# Patient Record
Sex: Male | Born: 1980 | Race: White | Hispanic: No | Marital: Married | Smoking: Never smoker
Health system: Southern US, Community
[De-identification: ages and names within clinical notes are randomized; demographics above are authoritative.]

## PROBLEM LIST (undated history)

## (undated) DIAGNOSIS — R519 Headache, unspecified: Secondary | ICD-10-CM

## (undated) DIAGNOSIS — M1990 Unspecified osteoarthritis, unspecified site: Secondary | ICD-10-CM

## (undated) DIAGNOSIS — M6282 Rhabdomyolysis: Secondary | ICD-10-CM

## (undated) DIAGNOSIS — R51 Headache: Secondary | ICD-10-CM

## (undated) DIAGNOSIS — J189 Pneumonia, unspecified organism: Secondary | ICD-10-CM

## (undated) HISTORY — PX: ELBOW SURGERY: SHX618

## (undated) HISTORY — PX: HIP SURGERY: SHX245

## (undated) HISTORY — PX: ANKLE SURGERY: SHX546

---

## 2011-10-31 HISTORY — PX: HIP SURGERY: SHX245

## 2012-12-30 HISTORY — PX: HERNIA REPAIR: SHX51

## 2017-07-17 ENCOUNTER — Other Ambulatory Visit: Payer: Self-pay | Admitting: Orthopedic Surgery

## 2017-07-21 NOTE — Pre-Procedure Instructions (Signed)
Lawson Mahone  07/21/2017      [facility] - [place], [place] - [address] [place] [address] Phone: [phone] Fax: [phone]    Your procedure is scheduled on Monday August 6.  Report to [facility] Admitting at 1:00 P.M.  Call this number if you have problems the morning of surgery:  [phone]   Remember:  Do not eat food or drink liquids after midnight.  Take these medicines the morning of surgery with A SIP OF WATER: NONE  7 days prior to surgery STOP taking any Aspirin, Aleve, Naproxen, Ibuprofen, Motrin, Advil, Goody's, BC's, all herbal medications, fish oil, and all vitamins    Do not wear jewelry, make-up or nail polish.  Do not wear lotions, powders, or colognes, or deoderant.    Men may shave face and neck.  Do not bring valuables to the hospital.  [facility] is not responsible for any belongings or valuables.  Contacts, dentures or bridgework may not be worn into surgery.  Leave your suitcase in the car.  After surgery it may be brought to your room.  For patients admitted to the hospital, discharge time will be determined by your treatment team.  Patients discharged the day of surgery will not be allowed to drive home.    Special instructions:    [place]- Preparing For Surgery  Before surgery, you can play an important role. Because skin is not sterile, your skin needs to be as free of germs as possible. You can reduce the number of germs on your skin by washing with CHG (chlorahexidine gluconate) Soap before surgery.  CHG is an antiseptic cleaner which kills germs and bonds with the skin to continue killing germs even after washing.  Please do not use if you have an allergy to CHG or antibacterial soaps. If your skin becomes reddened/irritated stop using the CHG.  Do not shave (including legs and underarms) for at least 48 hours prior to first CHG shower. It is OK to shave your face.  Please follow these  instructions carefully.   1. Shower the NIGHT BEFORE SURGERY and the MORNING OF SURGERY with CHG.   2. If you chose to wash your hair, wash your hair first as usual with your normal shampoo.  3. After you shampoo, rinse your hair and body thoroughly to remove the shampoo.  4. Use CHG as you would any other liquid soap. You can apply CHG directly to the skin and wash gently with a scrungie or a clean washcloth.   5. Apply the CHG Soap to your body ONLY FROM THE NECK DOWN.  Do not use on open wounds or open sores. Avoid contact with your eyes, ears, mouth and genitals (private parts). Wash genitals (private parts) with your normal soap.  6. Wash thoroughly, paying special attention to the area where your surgery will be performed.  7. Thoroughly rinse your body with warm water from the neck down.  8. DO NOT shower/wash with your normal soap after using and rinsing off the CHG Soap.  9. Pat yourself dry with a CLEAN TOWEL.   10. Wear CLEAN PAJAMAS   11. Place CLEAN SHEETS on your bed the night of your first shower and DO NOT SLEEP WITH PETS.    Day of Surgery: Do not apply any deodorants/lotions. Please wear clean clothes to the hospital/surgery center.      Please read over the following fact sheets that you were given. Total  Joint Packet and MRSA Information

## 2017-07-22 ENCOUNTER — Inpatient Hospital Stay (HOSPITAL_COMMUNITY)
Admission: RE | Admit: 2017-07-22 | Discharge: 2017-07-22 | Disposition: A | Discharge status: Home / Self Care | Payer: Self-pay | Source: Ambulatory Visit

## 2017-07-24 ENCOUNTER — Encounter (HOSPITAL_COMMUNITY)
Admission: RE | Admit: 2017-07-24 | Discharge: 2017-07-24 | Disposition: A | Discharge status: Home / Self Care | Payer: BLUE CROSS/BLUE SHIELD | Source: Ambulatory Visit | Attending: Orthopedic Surgery | Admitting: Orthopedic Surgery

## 2017-07-24 ENCOUNTER — Ambulatory Visit (HOSPITAL_COMMUNITY)
Admission: RE | Admit: 2017-07-24 | Discharge: 2017-07-24 | Disposition: A | Discharge status: Home / Self Care | Payer: BLUE CROSS/BLUE SHIELD | Source: Ambulatory Visit | Attending: Orthopedic Surgery | Admitting: Orthopedic Surgery

## 2017-07-24 ENCOUNTER — Encounter (HOSPITAL_COMMUNITY): Payer: Self-pay

## 2017-07-24 DIAGNOSIS — Z01818 Encounter for other preprocedural examination: Secondary | ICD-10-CM | POA: Insufficient documentation

## 2017-07-24 DIAGNOSIS — Z01812 Encounter for preprocedural laboratory examination: Secondary | ICD-10-CM | POA: Insufficient documentation

## 2017-07-24 HISTORY — DX: Headache: R51

## 2017-07-24 HISTORY — DX: Pneumonia, unspecified organism: J18.9

## 2017-07-24 HISTORY — DX: Unspecified osteoarthritis, unspecified site: M19.90

## 2017-07-24 HISTORY — DX: Rhabdomyolysis: M62.82

## 2017-07-24 HISTORY — DX: Headache, unspecified: R51.9

## 2017-07-24 LAB — TYPE AND SCREEN
ABO/RH(D): O POS
ANTIBODY SCREEN: NEGATIVE

## 2017-07-24 LAB — URINALYSIS, ROUTINE W REFLEX MICROSCOPIC
BILIRUBIN URINE: NEGATIVE
Glucose, UA: NEGATIVE mg/dL
Hgb urine dipstick: NEGATIVE
Ketones, ur: NEGATIVE mg/dL
Leukocytes, UA: NEGATIVE
NITRITE: NEGATIVE
PH: 7 (ref 5.0–8.0)
Protein, ur: NEGATIVE mg/dL
SPECIFIC GRAVITY, URINE: 1.019 (ref 1.005–1.030)

## 2017-07-24 LAB — COMPREHENSIVE METABOLIC PANEL
ALT: 48 U/L (ref 17–63)
ANION GAP: 8 (ref 5–15)
AST: 28 U/L (ref 15–41)
Albumin: 4.6 g/dL (ref 3.5–5.0)
Alkaline Phosphatase: 64 U/L (ref 38–126)
BILIRUBIN TOTAL: 0.6 mg/dL (ref 0.3–1.2)
BUN: 12 mg/dL (ref 6–20)
CO2: 28 mmol/L (ref 22–32)
Calcium: 9.6 mg/dL (ref 8.9–10.3)
Chloride: 105 mmol/L (ref 101–111)
Creatinine, Ser: 1.13 mg/dL (ref 0.61–1.24)
Glucose, Bld: 99 mg/dL (ref 65–99)
POTASSIUM: 4.1 mmol/L (ref 3.5–5.1)
Sodium: 141 mmol/L (ref 135–145)
TOTAL PROTEIN: 7.4 g/dL (ref 6.5–8.1)

## 2017-07-24 LAB — SURGICAL PCR SCREEN
MRSA, PCR: NEGATIVE
Staphylococcus aureus: NEGATIVE

## 2017-07-24 LAB — CBC WITH DIFFERENTIAL/PLATELET
Basophils Absolute: 0 10*3/uL (ref 0.0–0.1)
Basophils Relative: 0 %
EOS PCT: 3 %
Eosinophils Absolute: 0.2 10*3/uL (ref 0.0–0.7)
HEMATOCRIT: 47.2 % (ref 39.0–52.0)
Hemoglobin: 16.3 g/dL (ref 13.0–17.0)
LYMPHS PCT: 22 %
Lymphs Abs: 1.5 10*3/uL (ref 0.7–4.0)
MCH: 29.9 pg (ref 26.0–34.0)
MCHC: 34.5 g/dL (ref 30.0–36.0)
MCV: 86.6 fL (ref 78.0–100.0)
MONO ABS: 0.3 10*3/uL (ref 0.1–1.0)
MONOS PCT: 5 %
NEUTROS ABS: 4.7 10*3/uL (ref 1.7–7.7)
Neutrophils Relative %: 70 %
Platelets: 227 10*3/uL (ref 150–400)
RBC: 5.45 MIL/uL (ref 4.22–5.81)
RDW: 12.9 % (ref 11.5–15.5)
WBC: 6.8 10*3/uL (ref 4.0–10.5)

## 2017-07-24 LAB — ABO/RH: ABO/RH(D): O POS

## 2017-07-24 LAB — PROTIME-INR
INR: 1.09
PROTHROMBIN TIME: 14.1 s (ref 11.4–15.2)

## 2017-07-24 LAB — APTT: aPTT: 31 seconds (ref 24–36)

## 2017-07-24 NOTE — Progress Notes (Signed)
Doesn't have a PCP States he saw a cardiologist years ago states everything checked out ok Denies any fever, cough, or chest pain. States [facility] he developed Rhabdomyolysis. The surgery took around 13 hours.

## 2017-07-28 ENCOUNTER — Encounter (HOSPITAL_COMMUNITY): Payer: Self-pay
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
## 2017-08-04 ENCOUNTER — Inpatient Hospital Stay (HOSPITAL_COMMUNITY)
Admission: RE | Admit: 2017-08-04 | Discharge: 2017-08-05 | DRG: 470 | Disposition: A | Discharge status: Home / Self Care | Payer: BLUE CROSS/BLUE SHIELD | Source: Ambulatory Visit | Attending: Orthopedic Surgery | Admitting: Orthopedic Surgery

## 2017-08-04 ENCOUNTER — Inpatient Hospital Stay (HOSPITAL_COMMUNITY): Payer: BLUE CROSS/BLUE SHIELD

## 2017-08-04 ENCOUNTER — Inpatient Hospital Stay (HOSPITAL_COMMUNITY): Payer: BLUE CROSS/BLUE SHIELD | Admitting: Vascular Surgery

## 2017-08-04 ENCOUNTER — Encounter (HOSPITAL_COMMUNITY): Payer: Self-pay | Admitting: Certified Registered"

## 2017-08-04 ENCOUNTER — Encounter (HOSPITAL_COMMUNITY)
Admission: RE | Disposition: A | Discharge status: Home / Self Care | Payer: Self-pay | Source: Ambulatory Visit | Attending: Orthopedic Surgery

## 2017-08-04 DIAGNOSIS — M25552 Pain in left hip: Secondary | ICD-10-CM | POA: Diagnosis present

## 2017-08-04 DIAGNOSIS — M1612 Unilateral primary osteoarthritis, left hip: Secondary | ICD-10-CM | POA: Diagnosis present

## 2017-08-04 DIAGNOSIS — M87052 Idiopathic aseptic necrosis of left femur: Secondary | ICD-10-CM | POA: Diagnosis present

## 2017-08-04 DIAGNOSIS — Z96642 Presence of left artificial hip joint: Secondary | ICD-10-CM

## 2017-08-04 HISTORY — PX: TOTAL HIP ARTHROPLASTY: SHX124

## 2017-08-04 SURGERY — ARTHROPLASTY, HIP, TOTAL, ANTERIOR APPROACH
Anesthesia: Spinal | Site: Hip | Laterality: Left

## 2017-08-04 MED ORDER — PROPOFOL 10 MG/ML IV BOLUS
INTRAVENOUS | Status: DC | PRN
  Administered 2017-08-04: 30 mg via INTRAVENOUS
  Administered 2017-08-04: 20 mg via INTRAVENOUS
  Administered 2017-08-04: 50 mg via INTRAVENOUS
  Administered 2017-08-04: 40 mg via INTRAVENOUS
  Administered 2017-08-04: 30 mg via INTRAVENOUS

## 2017-08-04 MED ORDER — CHLORHEXIDINE GLUCONATE 4 % EX LIQD: 60.0000 mL | Freq: Once | CUTANEOUS | Status: DC

## 2017-08-04 MED ORDER — OXYCODONE HCL 5 MG PO TABS
ORAL_TABLET | ORAL | Status: AC
  Administered 2017-08-04: 10 mg
  Filled 2017-08-04: qty 2

## 2017-08-04 MED ORDER — GABAPENTIN 300 MG PO CAPS
300.0000 mg | ORAL_CAPSULE | Freq: Two times a day (BID) | ORAL | Status: DC
  Administered 2017-08-04 – 2017-08-05 (×2): 300 mg via ORAL
  Filled 2017-08-04 (×2): qty 1

## 2017-08-04 MED ORDER — PROMETHAZINE HCL 25 MG/ML IJ SOLN: 12.5000 mg | Freq: Four times a day (QID) | INTRAMUSCULAR | Status: DC | PRN

## 2017-08-04 MED ORDER — FENTANYL CITRATE (PF) 250 MCG/5ML IJ SOLN
INTRAMUSCULAR | Status: AC
  Filled 2017-08-04: qty 5

## 2017-08-04 MED ORDER — BISACODYL 5 MG PO TBEC: 5.0000 mg | DELAYED_RELEASE_TABLET | Freq: Every day | ORAL | Status: DC | PRN

## 2017-08-04 MED ORDER — MIDAZOLAM HCL 5 MG/5ML IJ SOLN
INTRAMUSCULAR | Status: DC | PRN
  Administered 2017-08-04: 2 mg via INTRAVENOUS

## 2017-08-04 MED ORDER — TIZANIDINE HCL 2 MG PO TABS: 2.0000 mg | ORAL_TABLET | Freq: Three times a day (TID) | ORAL | 0 refills | Status: AC | PRN

## 2017-08-04 MED ORDER — OXYCODONE HCL 5 MG PO TABS
5.0000 mg | ORAL_TABLET | ORAL | Status: DC | PRN
  Administered 2017-08-04: 10 mg via ORAL
  Administered 2017-08-05: 5 mg via ORAL
  Filled 2017-08-04: qty 1
  Filled 2017-08-04: qty 2

## 2017-08-04 MED ORDER — ACETAMINOPHEN 325 MG PO TABS
650.0000 mg | ORAL_TABLET | Freq: Four times a day (QID) | ORAL | Status: DC | PRN
  Administered 2017-08-04: 650 mg via ORAL
  Filled 2017-08-04: qty 2

## 2017-08-04 MED ORDER — ALBUMIN HUMAN 5 % IV SOLN
INTRAVENOUS | Status: DC | PRN
  Administered 2017-08-04: 17:00:00 via INTRAVENOUS

## 2017-08-04 MED ORDER — DOCUSATE SODIUM 100 MG PO CAPS
100.0000 mg | ORAL_CAPSULE | Freq: Two times a day (BID) | ORAL | Status: DC
  Administered 2017-08-05: 100 mg via ORAL
  Filled 2017-08-04: qty 1

## 2017-08-04 MED ORDER — 0.9 % SODIUM CHLORIDE (POUR BTL) OPTIME
TOPICAL | Status: DC | PRN
  Administered 2017-08-04: 1000 mL

## 2017-08-04 MED ORDER — METHOCARBAMOL 500 MG PO TABS
500.0000 mg | ORAL_TABLET | Freq: Four times a day (QID) | ORAL | Status: DC | PRN
  Filled 2017-08-04: qty 1

## 2017-08-04 MED ORDER — HYDROMORPHONE HCL 1 MG/ML IJ SOLN: 1.0000 mg | INTRAMUSCULAR | Status: DC | PRN

## 2017-08-04 MED ORDER — ASPIRIN EC 325 MG PO TBEC: 325.0000 mg | DELAYED_RELEASE_TABLET | Freq: Two times a day (BID) | ORAL | 0 refills | Status: AC

## 2017-08-04 MED ORDER — LACTATED RINGERS IV SOLN: INTRAVENOUS | Status: DC

## 2017-08-04 MED ORDER — FENTANYL CITRATE (PF) 100 MCG/2ML IJ SOLN
INTRAMUSCULAR | Status: DC | PRN
  Administered 2017-08-04: 50 ug via INTRAVENOUS

## 2017-08-04 MED ORDER — TRANEXAMIC ACID 1000 MG/10ML IV SOLN
1000.0000 mg | INTRAVENOUS | Status: AC
  Administered 2017-08-04: 1000 mg via INTRAVENOUS
  Filled 2017-08-04: qty 10

## 2017-08-04 MED ORDER — DIPHENHYDRAMINE HCL 12.5 MG/5ML PO ELIX: 12.5000 mg | ORAL_SOLUTION | ORAL | Status: DC | PRN

## 2017-08-04 MED ORDER — PHENYLEPHRINE 40 MCG/ML (10ML) SYRINGE FOR IV PUSH (FOR BLOOD PRESSURE SUPPORT)
PREFILLED_SYRINGE | INTRAVENOUS | Status: AC
  Filled 2017-08-04: qty 10

## 2017-08-04 MED ORDER — BUPIVACAINE HCL 0.5 % IJ SOLN
INTRAMUSCULAR | Status: DC | PRN
  Administered 2017-08-04: 20 mL

## 2017-08-04 MED ORDER — BUPIVACAINE LIPOSOME 1.3 % IJ SUSP
20.0000 mL | Freq: Once | INTRAMUSCULAR | Status: DC
  Filled 2017-08-04: qty 20

## 2017-08-04 MED ORDER — BUPIVACAINE-EPINEPHRINE (PF) 0.5% -1:200000 IJ SOLN
INTRAMUSCULAR | Status: AC
  Filled 2017-08-04: qty 30

## 2017-08-04 MED ORDER — ALUM & MAG HYDROXIDE-SIMETH 200-200-20 MG/5ML PO SUSP: 30.0000 mL | ORAL | Status: DC | PRN

## 2017-08-04 MED ORDER — MAGNESIUM CITRATE PO SOLN: 1.0000 | Freq: Once | ORAL | Status: DC | PRN

## 2017-08-04 MED ORDER — ONDANSETRON HCL 4 MG/2ML IJ SOLN
INTRAMUSCULAR | Status: AC
  Filled 2017-08-04: qty 2

## 2017-08-04 MED ORDER — DEXAMETHASONE SODIUM PHOSPHATE 10 MG/ML IJ SOLN
INTRAMUSCULAR | Status: DC | PRN
  Administered 2017-08-04: 8 mg via INTRAVENOUS

## 2017-08-04 MED ORDER — SUCCINYLCHOLINE CHLORIDE 200 MG/10ML IV SOSY
PREFILLED_SYRINGE | INTRAVENOUS | Status: AC
  Filled 2017-08-04: qty 10

## 2017-08-04 MED ORDER — METHOCARBAMOL 1000 MG/10ML IJ SOLN
500.0000 mg | Freq: Four times a day (QID) | INTRAVENOUS | Status: DC | PRN
  Filled 2017-08-04: qty 5

## 2017-08-04 MED ORDER — DEXAMETHASONE SODIUM PHOSPHATE 10 MG/ML IJ SOLN
10.0000 mg | Freq: Two times a day (BID) | INTRAMUSCULAR | Status: DC
  Administered 2017-08-04 – 2017-08-05 (×2): 10 mg via INTRAVENOUS
  Filled 2017-08-04 (×2): qty 1

## 2017-08-04 MED ORDER — HYDROMORPHONE HCL 1 MG/ML IJ SOLN
0.2500 mg | INTRAMUSCULAR | Status: DC | PRN
  Administered 2017-08-04 (×3): 0.5 mg via INTRAVENOUS

## 2017-08-04 MED ORDER — SODIUM CHLORIDE 0.9 % IV SOLN
INTRAVENOUS | Status: DC
  Administered 2017-08-04: 23:00:00 via INTRAVENOUS

## 2017-08-04 MED ORDER — PROPOFOL 500 MG/50ML IV EMUL
INTRAVENOUS | Status: DC | PRN
  Administered 2017-08-04: 50 ug/kg/min via INTRAVENOUS

## 2017-08-04 MED ORDER — ONDANSETRON HCL 4 MG/2ML IJ SOLN: 4.0000 mg | Freq: Four times a day (QID) | INTRAMUSCULAR | Status: DC | PRN

## 2017-08-04 MED ORDER — CEFAZOLIN SODIUM-DEXTROSE 2-4 GM/100ML-% IV SOLN
2.0000 g | INTRAVENOUS | Status: AC
  Administered 2017-08-04: 2 g via INTRAVENOUS
  Filled 2017-08-04: qty 100

## 2017-08-04 MED ORDER — SODIUM CHLORIDE 0.9 % IV SOLN
1000.0000 mg | Freq: Once | INTRAVENOUS | Status: AC
  Administered 2017-08-04: 1000 mg via INTRAVENOUS
  Filled 2017-08-04: qty 10

## 2017-08-04 MED ORDER — HYDROMORPHONE HCL 1 MG/ML IJ SOLN
INTRAMUSCULAR | Status: AC
  Administered 2017-08-04: 0.5 mg via INTRAVENOUS
  Filled 2017-08-04: qty 1

## 2017-08-04 MED ORDER — LACTATED RINGERS IV SOLN
INTRAVENOUS | Status: DC | PRN
  Administered 2017-08-04 (×3): via INTRAVENOUS

## 2017-08-04 MED ORDER — DOCUSATE SODIUM 100 MG PO CAPS: 100.0000 mg | ORAL_CAPSULE | Freq: Two times a day (BID) | ORAL | 0 refills | Status: AC

## 2017-08-04 MED ORDER — EPHEDRINE 5 MG/ML INJ
INTRAVENOUS | Status: AC
  Filled 2017-08-04: qty 10

## 2017-08-04 MED ORDER — ONDANSETRON HCL 4 MG PO TABS: 4.0000 mg | ORAL_TABLET | Freq: Four times a day (QID) | ORAL | Status: DC | PRN

## 2017-08-04 MED ORDER — ONDANSETRON HCL 4 MG/2ML IJ SOLN
INTRAMUSCULAR | Status: DC | PRN
  Administered 2017-08-04: 4 mg via INTRAVENOUS

## 2017-08-04 MED ORDER — LIDOCAINE 2% (20 MG/ML) 5 ML SYRINGE
INTRAMUSCULAR | Status: DC | PRN
  Administered 2017-08-04: 50 mg via INTRAVENOUS

## 2017-08-04 MED ORDER — LIDOCAINE 2% (20 MG/ML) 5 ML SYRINGE
INTRAMUSCULAR | Status: AC
  Filled 2017-08-04: qty 5

## 2017-08-04 MED ORDER — CELECOXIB 200 MG PO CAPS
200.0000 mg | ORAL_CAPSULE | Freq: Two times a day (BID) | ORAL | Status: DC
  Administered 2017-08-04 – 2017-08-05 (×2): 200 mg via ORAL
  Filled 2017-08-04 (×2): qty 1

## 2017-08-04 MED ORDER — ZOLPIDEM TARTRATE 5 MG PO TABS: 5.0000 mg | ORAL_TABLET | Freq: Every evening | ORAL | Status: DC | PRN

## 2017-08-04 MED ORDER — MIDAZOLAM HCL 2 MG/2ML IJ SOLN
INTRAMUSCULAR | Status: AC
  Filled 2017-08-04: qty 2

## 2017-08-04 MED ORDER — OXYCODONE-ACETAMINOPHEN 5-325 MG PO TABS: 1.0000 | ORAL_TABLET | ORAL | 0 refills | Status: AC | PRN

## 2017-08-04 MED ORDER — POLYETHYLENE GLYCOL 3350 17 G PO PACK: 17.0000 g | PACK | Freq: Every day | ORAL | Status: DC | PRN

## 2017-08-04 MED ORDER — BUPIVACAINE LIPOSOME 1.3 % IJ SUSP
INTRAMUSCULAR | Status: DC | PRN
  Administered 2017-08-04: 20 mL

## 2017-08-04 MED ORDER — PHENYLEPHRINE 40 MCG/ML (10ML) SYRINGE FOR IV PUSH (FOR BLOOD PRESSURE SUPPORT)
PREFILLED_SYRINGE | INTRAVENOUS | Status: DC | PRN
  Administered 2017-08-04 (×2): 80 ug via INTRAVENOUS

## 2017-08-04 MED ORDER — ASPIRIN EC 325 MG PO TBEC
325.0000 mg | DELAYED_RELEASE_TABLET | Freq: Two times a day (BID) | ORAL | Status: DC
  Administered 2017-08-05: 325 mg via ORAL
  Filled 2017-08-04: qty 1

## 2017-08-04 MED ORDER — CEFAZOLIN SODIUM-DEXTROSE 2-4 GM/100ML-% IV SOLN
2.0000 g | Freq: Four times a day (QID) | INTRAVENOUS | Status: AC
  Administered 2017-08-04 – 2017-08-05 (×2): 2 g via INTRAVENOUS
  Filled 2017-08-04 (×2): qty 100

## 2017-08-04 MED ORDER — DEXAMETHASONE SODIUM PHOSPHATE 10 MG/ML IJ SOLN
INTRAMUSCULAR | Status: AC
  Filled 2017-08-04: qty 1

## 2017-08-04 MED ORDER — BUPIVACAINE HCL (PF) 0.5 % IJ SOLN
INTRAMUSCULAR | Status: AC
  Filled 2017-08-04: qty 30

## 2017-08-04 MED ORDER — ACETAMINOPHEN 650 MG RE SUPP: 650.0000 mg | Freq: Four times a day (QID) | RECTAL | Status: DC | PRN

## 2017-08-04 SURGICAL SUPPLY — 57 items
BENZOIN TINCTURE PRP APPL 2/3 (GAUZE/BANDAGES/DRESSINGS) ×3 IMPLANT
BLADE CLIPPER SURG (BLADE) IMPLANT
BLADE SAG 18X100X1.27 (BLADE) ×3 IMPLANT
BNDG COHESIVE 6X5 TAN STRL LF (GAUZE/BANDAGES/DRESSINGS) IMPLANT
BNDG GAUZE ELAST 4 BULKY (GAUZE/BANDAGES/DRESSINGS) IMPLANT
BUR EGG ELITE 4.0 (BURR) ×2 IMPLANT
BUR EGG ELITE 4.0MM (BURR) ×1
BUR EGG ELITE 5.0 (BURR) ×2 IMPLANT
BUR EGG ELITE 5.0MM (BURR) ×1
CAPT HIP TOTAL 2 ×3 IMPLANT
CLOSURE STERI-STRIP 1/2X4 (GAUZE/BANDAGES/DRESSINGS)
CLSR STERI-STRIP ANTIMIC 1/2X4 (GAUZE/BANDAGES/DRESSINGS) IMPLANT
COVER SURGICAL LIGHT HANDLE (MISCELLANEOUS) ×3 IMPLANT
DRAPE C-ARM 42X72 X-RAY (DRAPES) ×3 IMPLANT
DRAPE ORTHO SPLIT 77X108 STRL (DRAPES) ×6
DRAPE STERI IOBAN 125X83 (DRAPES) ×3 IMPLANT
DRAPE SURG ORHT 6 SPLT 77X108 (DRAPES) ×3 IMPLANT
DRAPE U-SHAPE 47X51 STRL (DRAPES) ×9 IMPLANT
DRSG AQUACEL AG ADV 3.5X10 (GAUZE/BANDAGES/DRESSINGS) ×3 IMPLANT
DURAPREP 26ML APPLICATOR (WOUND CARE) ×6 IMPLANT
ELECT BLADE 4.0 EZ CLEAN MEGAD (MISCELLANEOUS) ×3
ELECT CAUTERY BLADE 6.4 (BLADE) ×6 IMPLANT
ELECT REM PT RETURN 9FT ADLT (ELECTROSURGICAL) ×3
ELECTRODE BLDE 4.0 EZ CLN MEGD (MISCELLANEOUS) ×1 IMPLANT
ELECTRODE REM PT RTRN 9FT ADLT (ELECTROSURGICAL) ×1 IMPLANT
GLOVE BIOGEL PI IND STRL 8 (GLOVE) ×2 IMPLANT
GLOVE BIOGEL PI INDICATOR 8 (GLOVE) ×4
GLOVE ECLIPSE 7.5 STRL STRAW (GLOVE) ×6 IMPLANT
GOWN STRL REUS W/ TWL LRG LVL3 (GOWN DISPOSABLE) ×2 IMPLANT
GOWN STRL REUS W/ TWL XL LVL3 (GOWN DISPOSABLE) ×2 IMPLANT
GOWN STRL REUS W/TWL LRG LVL3 (GOWN DISPOSABLE) ×4
GOWN STRL REUS W/TWL XL LVL3 (GOWN DISPOSABLE) ×4
HOOD PEEL AWAY FACE SHEILD DIS (HOOD) ×9 IMPLANT
IMMOBILIZER KNEE 22 UNIV (SOFTGOODS) ×3 IMPLANT
KIT BASIN OR (CUSTOM PROCEDURE TRAY) ×3 IMPLANT
KIT ROOM TURNOVER OR (KITS) ×3 IMPLANT
MANIFOLD NEPTUNE II (INSTRUMENTS) ×3 IMPLANT
NEEDLE SPNL 22GX3.5 QUINCKE BK (NEEDLE) ×3 IMPLANT
NS IRRIG 1000ML POUR BTL (IV SOLUTION) ×3 IMPLANT
PACK TOTAL JOINT (CUSTOM PROCEDURE TRAY) ×3 IMPLANT
PACK UNIVERSAL I (CUSTOM PROCEDURE TRAY) ×3 IMPLANT
PAD ARMBOARD 7.5X6 YLW CONV (MISCELLANEOUS) ×6 IMPLANT
SPONGE LAP 18X18 X RAY DECT (DISPOSABLE) ×3 IMPLANT
STAPLER VISISTAT 35W (STAPLE) IMPLANT
SUT ETHIBOND NAB CT1 #1 30IN (SUTURE) ×9 IMPLANT
SUT MNCRL AB 3-0 PS2 18 (SUTURE) IMPLANT
SUT VIC AB 0 CT1 27 (SUTURE) ×2
SUT VIC AB 0 CT1 27XBRD ANBCTR (SUTURE) ×1 IMPLANT
SUT VIC AB 1 CT1 27 (SUTURE) ×4
SUT VIC AB 1 CT1 27XBRD ANBCTR (SUTURE) ×2 IMPLANT
SUT VIC AB 2-0 CT1 27 (SUTURE) ×2
SUT VIC AB 2-0 CT1 TAPERPNT 27 (SUTURE) ×1 IMPLANT
SYR 50ML LL SCALE MARK (SYRINGE) ×3 IMPLANT
TOWEL OR 17X24 6PK STRL BLUE (TOWEL DISPOSABLE) ×3 IMPLANT
TOWEL OR 17X26 10 PK STRL BLUE (TOWEL DISPOSABLE) ×3 IMPLANT
TRAY CATH 16FR W/PLASTIC CATH (SET/KITS/TRAYS/PACK) IMPLANT
TRAY FOLEY CATH SILVER 16FR (SET/KITS/TRAYS/PACK) IMPLANT

## 2017-08-04 NOTE — Anesthesia Procedure Notes (Signed)
Procedure Name: MAC Date/Time: 08/04/2017 3:10 PM Performed by: Orlie Dakin Pre-anesthesia Checklist: Timeout performed, Patient being monitored, Suction available, Emergency Drugs available and Patient identified Oxygen Delivery Method: Simple face mask

## 2017-08-04 NOTE — Brief Op Note (Signed)
08/04/2017  5:38 PM  PATIENT:  Paul MatasKevin Elliott  36 y.o. 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  POST-OPERATIVE DIAGNOSIS:  OSTEOARTHRITIS LEFT HIP STATUS POST VASCULARIZED FREE FIBULA  PROCEDURE:  Procedure(s): TOTAL HIP ARTHROPLASTY POSTERIOR APPROACH (Left)  SURGEON:  Surgeon(s) and Role:    Jodi Geralds* Riley Papin, MD - Primary  PHYSICIAN ASSISTANT:   ASSISTANTS: jim bethune   ANESTHESIA:   spinal  EBL:  Total I/O In: 2250 [I.V.:2000; IV Piggyback:250] Out: 615 [Urine:115; Blood:500]  BLOOD ADMINISTERED:none  DRAINS: none   LOCAL MEDICATIONS USED:  MARCAINE    and OTHER experel SPECIMEN:  No Specimen  DISPOSITION OF SPECIMEN:  N/A  COUNTS:  YES  TOURNIQUET:  * No tourniquets in log *  DICTATION: .Other Dictation: Dictation Number [id number]  PLAN OF CARE: Admit to inpatient   PATIENT DISPOSITION:  PACU - hemodynamically stable.   Delay start of Pharmacological VTE agent (>24hrs) due to surgical blood loss or risk of bleeding: no

## 2017-08-04 NOTE — Anesthesia Preprocedure Evaluation (Signed)
Anesthesia Evaluation  Patient identified by MRN, date of birth, ID band Patient awake    Reviewed: Allergy & Precautions, H&P , Patient's Chart, lab work & pertinent test results, reviewed documented beta blocker date and time   Airway Mallampati: II  TM Distance: >3 FB Neck ROM: full    Dental no notable dental hx.    Pulmonary    Pulmonary exam normal breath sounds clear to auscultation       Cardiovascular  Rhythm:regular Rate:Normal     Neuro/Psych    GI/Hepatic   Endo/Other    Renal/GU      Musculoskeletal   Abdominal   Peds  Hematology   Anesthesia Other Findings   Reproductive/Obstetrics                             Anesthesia Physical Anesthesia Plan  ASA: II  Anesthesia Plan: Spinal   Post-op Pain Management:    Induction:   PONV Risk Score and Plan: 2 and Ondansetron and Dexamethasone  Airway Management Planned:   Additional Equipment:   Intra-op Plan:   Post-operative Plan:   Informed Consent: I have reviewed the patients History and Physical, chart, labs and discussed the procedure including the risks, benefits and alternatives for the proposed anesthesia with the patient or authorized representative who has indicated his/her understanding and acceptance.   Dental Advisory Given  Plan Discussed with: CRNA and Surgeon  Anesthesia Plan Comments: (  )        Anesthesia Quick Evaluation

## 2017-08-04 NOTE — Discharge Instructions (Signed)
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
## 2017-08-04 NOTE — Transfer of Care (Signed)
Immediate Anesthesia Transfer of Care Note  Patient: Paul MatasKevin Elliott  Procedure(s) Performed: Procedure(s): TOTAL HIP ARTHROPLASTY POSTERIOR APPROACH (Left)  Patient Location: PACU  Anesthesia Type:Spinal  Level of Consciousness: awake, oriented and patient cooperative  Airway & Oxygen Therapy: Patient Spontanous Breathing  Post-op Assessment: Report given to RN and Post -op Vital signs reviewed and stable  Post vital signs: Reviewed and stable  Last Vitals:  Vitals:   08/04/17 1333 08/04/17 1800  BP: (!) 140/93 115/80  Pulse: (!) 106 83  Resp: 20 16  Temp: 36.6 C 36.8 C    Last Pain:  Vitals:   08/04/17 1800  TempSrc:   PainSc: 0-No pain      Patients Stated Pain Goal: 2 (08/04/17 1333)  Complications: No apparent anesthesia complications

## 2017-08-04 NOTE — Anesthesia Procedure Notes (Signed)
Spinal  Patient location during procedure: OR Staffing Anesthesiologist: Cristela BlueJACKSON, Deisha Stull Preanesthetic Checklist Completed: patient identified, site marked, surgical consent, pre-op evaluation, timeout performed, IV checked, risks and benefits discussed and monitors and equipment checked Spinal Block Patient position: sitting Prep: DuraPrep Patient monitoring: cardiac monitor, continuous pulse ox, blood pressure and heart rate Approach: midline Location: L2-3 Injection technique: catheter Needle Needle type: Tuohy and Sprotte  Needle gauge: 24 G Needle length: 12.7 cm Needle insertion depth: 5 cm Catheter type: closed end flexible Catheter size: 19 g Catheter at skin depth: 12 cm Additional Notes 3cc .5% Bupiv SAB

## 2017-08-05 ENCOUNTER — Encounter (HOSPITAL_COMMUNITY): Payer: Self-pay | Admitting: Orthopedic Surgery

## 2017-08-05 LAB — CBC
HCT: 40.5 % (ref 39.0–52.0)
Hemoglobin: 14 g/dL (ref 13.0–17.0)
MCH: 29.3 pg (ref 26.0–34.0)
MCHC: 34.6 g/dL (ref 30.0–36.0)
MCV: 84.7 fL (ref 78.0–100.0)
PLATELETS: 241 10*3/uL (ref 150–400)
RBC: 4.78 MIL/uL (ref 4.22–5.81)
RDW: 12 % (ref 11.5–15.5)
WBC: 12.3 10*3/uL — AB (ref 4.0–10.5)

## 2017-08-05 LAB — BASIC METABOLIC PANEL
ANION GAP: 12 (ref 5–15)
BUN: 10 mg/dL (ref 6–20)
CALCIUM: 9 mg/dL (ref 8.9–10.3)
CO2: 21 mmol/L — ABNORMAL LOW (ref 22–32)
Chloride: 103 mmol/L (ref 101–111)
Creatinine, Ser: 0.96 mg/dL (ref 0.61–1.24)
GLUCOSE: 183 mg/dL — AB (ref 65–99)
Potassium: 4 mmol/L (ref 3.5–5.1)
Sodium: 136 mmol/L (ref 135–145)

## 2017-08-05 NOTE — Care Management Note (Signed)
Case Management Note  Patient Details  Name: Paul MatasKevin Elliott MRN: [id number] Date of Birth: 1981-09-09  Subjective/Objective: 36 yr okld gentleman s/p left total hip arthroplasty, posterior approach.  Action/Plan: Case manager spoke with patient concerning discharge plan and DME needs. Patient says he will do exercises provided, he can not afford co-pay for Home Health. He has a rolling walker, CM has ordered a 3in1. Will have family assistance at discharge.   Expected Discharge Date:  08/05/17               Expected Discharge Plan:  Home/Self Care  In-House Referral:  NA  Discharge planning Services  CM Consult  Post Acute Care Choice:  NA, Durable Medical Equipment Choice offered to:  Patient  DME Arranged:  3-N-1 DME Agency:  [facility]  HH Arranged:  [facility]  NA  Status of Service:  Completed, signed off  If discussed at Long Length of Stay Meetings, dates discussed:    Additional Comments:  Durenda GuthrieBrady, Avaiah Stempel Naomi, RN 08/05/2017, 12:42 PM
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
## 2017-08-05 NOTE — Anesthesia Postprocedure Evaluation (Signed)
Anesthesia Post Note  Patient: Paul MatasKevin Elliott  Procedure(s) Performed: Procedure(s) (LRB): TOTAL HIP ARTHROPLASTY POSTERIOR APPROACH (Left)     Patient location during evaluation: PACU Anesthesia Type: Spinal Level of consciousness: awake and alert Pain management: pain level controlled Vital Signs Assessment: post-procedure vital signs reviewed and stable Respiratory status: spontaneous breathing and respiratory function stable Cardiovascular status: blood pressure returned to baseline and stable Postop Assessment: no headache, no backache and spinal receding Anesthetic complications: no    Last Vitals:  Vitals:   08/04/17 2012 08/05/17 0708  BP: 117/68 120/79  Pulse: 74 89  Resp:  20  Temp: (!) 36.4 C 36.8 C    Last Pain:  Vitals:   08/05/17 0835  TempSrc:   PainSc: 6                  Phillips Groutarignan, Densil Ottey

## 2017-08-05 NOTE — Progress Notes (Signed)
Subjective: 1 Day Post-Op Procedure(s) (LRB): TOTAL HIP ARTHROPLASTY POSTERIOR APPROACH (Left) Patient reports pain as moderate.  Taking by mouth, voiding okay, positive flatus.   Objective: Vital signs in last 24 hours: Temp:  [97.5 F (36.4 C)-98.3 F (36.8 C)] 98.3 F (36.8 C) (08/07 0708) Pulse Rate:  [62-106] 89 (08/07 0708) Resp:  [10-20] 20 (08/07 0708) BP: (112-140)/(66-107) 120/79 (08/07 0708) SpO2:  [92 %-98 %] 92 % (08/07 0708) Weight:  [95.7 kg (211 lb)] 95.7 kg (211 lb) (08/06 1333)  Intake/Output from previous day: 08/06 0701 - 08/07 0700 In: 2640 [P.O.:240; I.V.:2150; IV Piggyback:250] Out: 3215 [Urine:2715; Blood:500] Intake/Output this shift: No intake/output data recorded.  No results for input(s): HGB in the last 72 hours. No results for input(s): WBC, RBC, HCT, PLT in the last 72 hours. No results for input(s): NA, K, CL, CO2, BUN, CREATININE, GLUCOSE, CALCIUM in the last 72 hours. No results for input(s): LABPT, INR in the last 72 hours.  Left hip exam: Neurovascular intact Sensation intact distally Intact pulses distally Dorsiflexion/Plantar flexion intact Incision: dressing C/D/I Compartment soft  Assessment/Plan: 1 Day Post-Op Procedure(s) (LRB): TOTAL HIP ARTHROPLASTY POSTERIOR APPROACH (Left) Discharge home with home health when passes physical therapy. WBAT on left with posterior hip precautions ASA 325 twice daily with SCDs for DVT prophylaxis.   Amaiya Scruton G 08/05/2017, 8:21 AM  [phone]

## 2017-08-05 NOTE — Op Note (Signed)
NAME:  Dorthula MatasDANIEL, Anas                     ACCOUNT NO.:  MEDICAL RECORD NO.:  [id number]  LOCATION:                                 FACILITY:  PHYSICIAN:  Harvie JuniorJohn L. Wayburn Shaler, M.D.        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  He had x-ray showing bone- on-bone change and he had failed a prolonged conservative course and multiple previous surgical procedures.  He was taken to the operating room for a left total hip replacement.  Because of his previous procedures, I felt that posterior approach was appropriate to give us better access to the proximal femur and to better work our way around his vascularized free fibula.  PREOPERATIVE DIAGNOSIS:  Retained deep hardware.  POSTOPERATIVE DIAGNOSIS:  Retained deep hardware.  PROCEDURES: 1. Left total hip replacement with an S-ROM system from a posterior     approach with an 18/13 stem, a 36 +8 offset stem, 18 D cone, a 36-     mm +0 delta ceramic hip ball, a 58 mm porous-coated cup, and a +4     neutral polyethylene liner, done in a previously operated left hip. 2. Removal of deep hardware. 3. Interpretation of multiple intraoperative fluoroscopic images.  SURGEON:  Harvie JuniorJohn L. Vonn Sliger, M.D.  DESCRIPTION OF PROCEDURE:  The patient was taken to the operating room and after adequate anesthesia was obtained with an epidural anesthetic, the patient was placed supine on the operating table.  He was then moved into the right lateral decubitus position.  All bony prominences were well padded.  Attention was then turned to the left hip.  He was placed into hip positioners and after routine prep and drape, we felt that we had adequate exposure of the hip and at this point, an incision was made for  posterior approach to the hip, subcutaneous tissues down to the level of the tensor fascia was divided in line with its fibers.  The gluteus maximus muscle was finger fractured and following this, retractors were put in place and we had to free up scar tissue both anteriorly and posteriorly over the vastus lateralis and Charnley retractor was put in place.  Tremendous scar over the lateral aspect of the hip, which was debrided.  We dissected down to the area where there was a pin from the previous vascularized free fibular graft.  This pin was removed.  Attention was then turned towards the posterior aspect of the femur, where the piriformis capsule and short external rotators were taken down and tagged.  The provisional neck cut was then made and attention was then turned towards the acetabulum.  Retractors were put in place anteriorly and posteriorly.  At this point, we sequentially reamed the acetabulum to a level of 57 mm and 58 porous-coated cup was placed with a hole eliminator placed.  Neutral liner was then placed. Attention was then turned to the femoral side.  At this point, we could identify where the vascularized free fibula was coming through the neck cut.  We carefully dissected down in the lateral position and then used a 4 mm drill with fluoroscopic guidance to get [place] through the portion of the vascularized free fibular, we went to a 6 and then an 8 and then we started our reamers.  We used fluoro on each of those drills to make sure we were in the canal.  We reamed up to an 11 and then took an 11 B cone and lateralized some.  We then sequentially reamed up to a level of 13 mm, took a 13.5 all the way down, and a 14 reamer part of the way down.  We then got the conical reamer dead center and first I took a motorized bur and burred out the portions of the fibula.  Interestingly, we could see the fibula and we could actually see a membrane of tissue between where the old  fibula was and the cancellous bones.  We burred out the fibula to try to not bias reamer at all.  Once that was done, we then seated it to a large cone 18 D reamed, and then this was trialed. This was put in place.  We used an 18/13 stem with a 36+ 8 offset in a neutral position.  With a +0 ball, we put the hip through a range of motion.  Excellent stability was achieved anteriorly as well as posteriorly at this point.  No tendency towards subluxation/dislocation. At this point, the trial was removed and the final was then put in place.  We thought we could get a touch better stability with a +4 neutral, so we placed that final polyethylene liner.  I took the trial out and put 3 D large cone in place.  I trialed it one more time just because I changed the liner with a 36+ 0 ball.  Excellent stability and range of motion were achieved.  At this point, the final ceramic ball was opened and put in place.  The hip reduced.  Excellent range of motion and stability were achieved.  At this point, the short external rotators, piriformis and posterior capsule were repaired to the posterior intertrochanteric line through drill holes.  The tensor fascia was closed with 1 Vicryl, skin with 0 and 2-0 Vicryl and 3-0 Monocryl subcuticular.  Benzoin and Steri-Strips were applied.  Sterile compressive dressing was applied.  The patient was taken to the recovery room and was noted to be in satisfactory condition.  Of note, multiple intraoperative fluoroscopic images were taken.  We did take advantage of the fact that we had a fluoro and to assess leg length, from an AP position and this appeared to be symmetric.  At this point, the patient was taken to the recovery room, where he was noted to be in satisfactory condition.  Estimated blood loss for procedure was about 500 mL, but the final could be gotten from the anesthetic record.  Complications during the procedure were none.     Harvie Junior,  M.D.     Ranae Plumber  D:  08/04/2017  T:  08/04/2017  Job:  [id number]  cc:   Harvie Junior, M.D.

## 2017-08-05 NOTE — Progress Notes (Signed)
Pt discharged home with dad. AVS and scripts reviewed with patient and family. All questions answered in full. DME at house. VSS. BP 121/75 (BP Location: Right Arm)   Pulse 80   Temp 98.2 F (36.8 C) (Oral)   Resp 16   Ht 5\' 9"  (1.753 m)   Wt 95.7 kg (211 lb)   SpO2 93%   BMI 31.16 kg/m

## 2018-07-20 IMAGING — CR DG CHEST 2V
2 series · 2 of 2 positions shown · non-contrast
Comparison: None.

CLINICAL DATA: Pt is having a left hip replacement on 08/04/17. No
chest complaints. Hx of PNA. Pt is a nonsmoker.

EXAM:
CHEST  2 VIEW

[w chest pa]
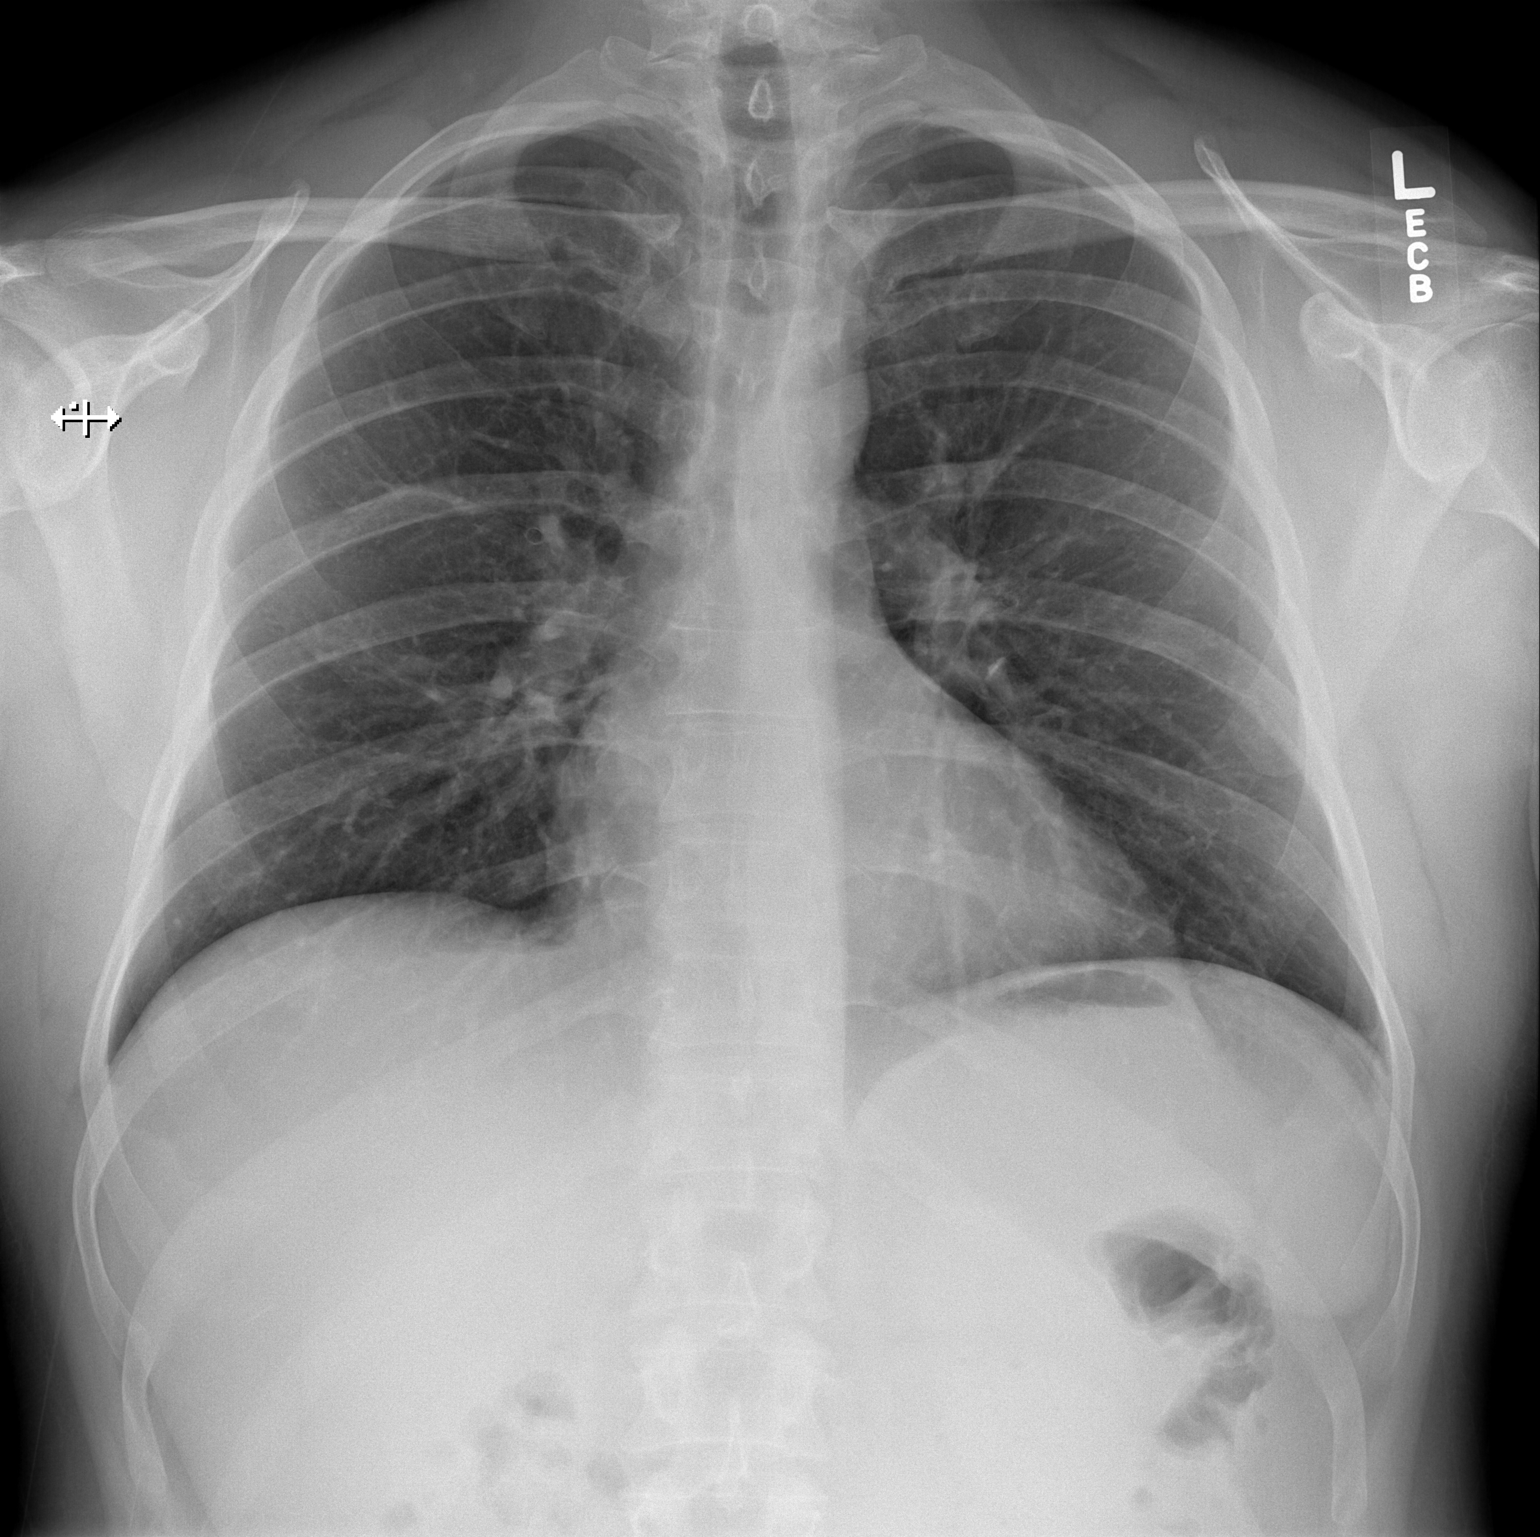

[w chest lat]
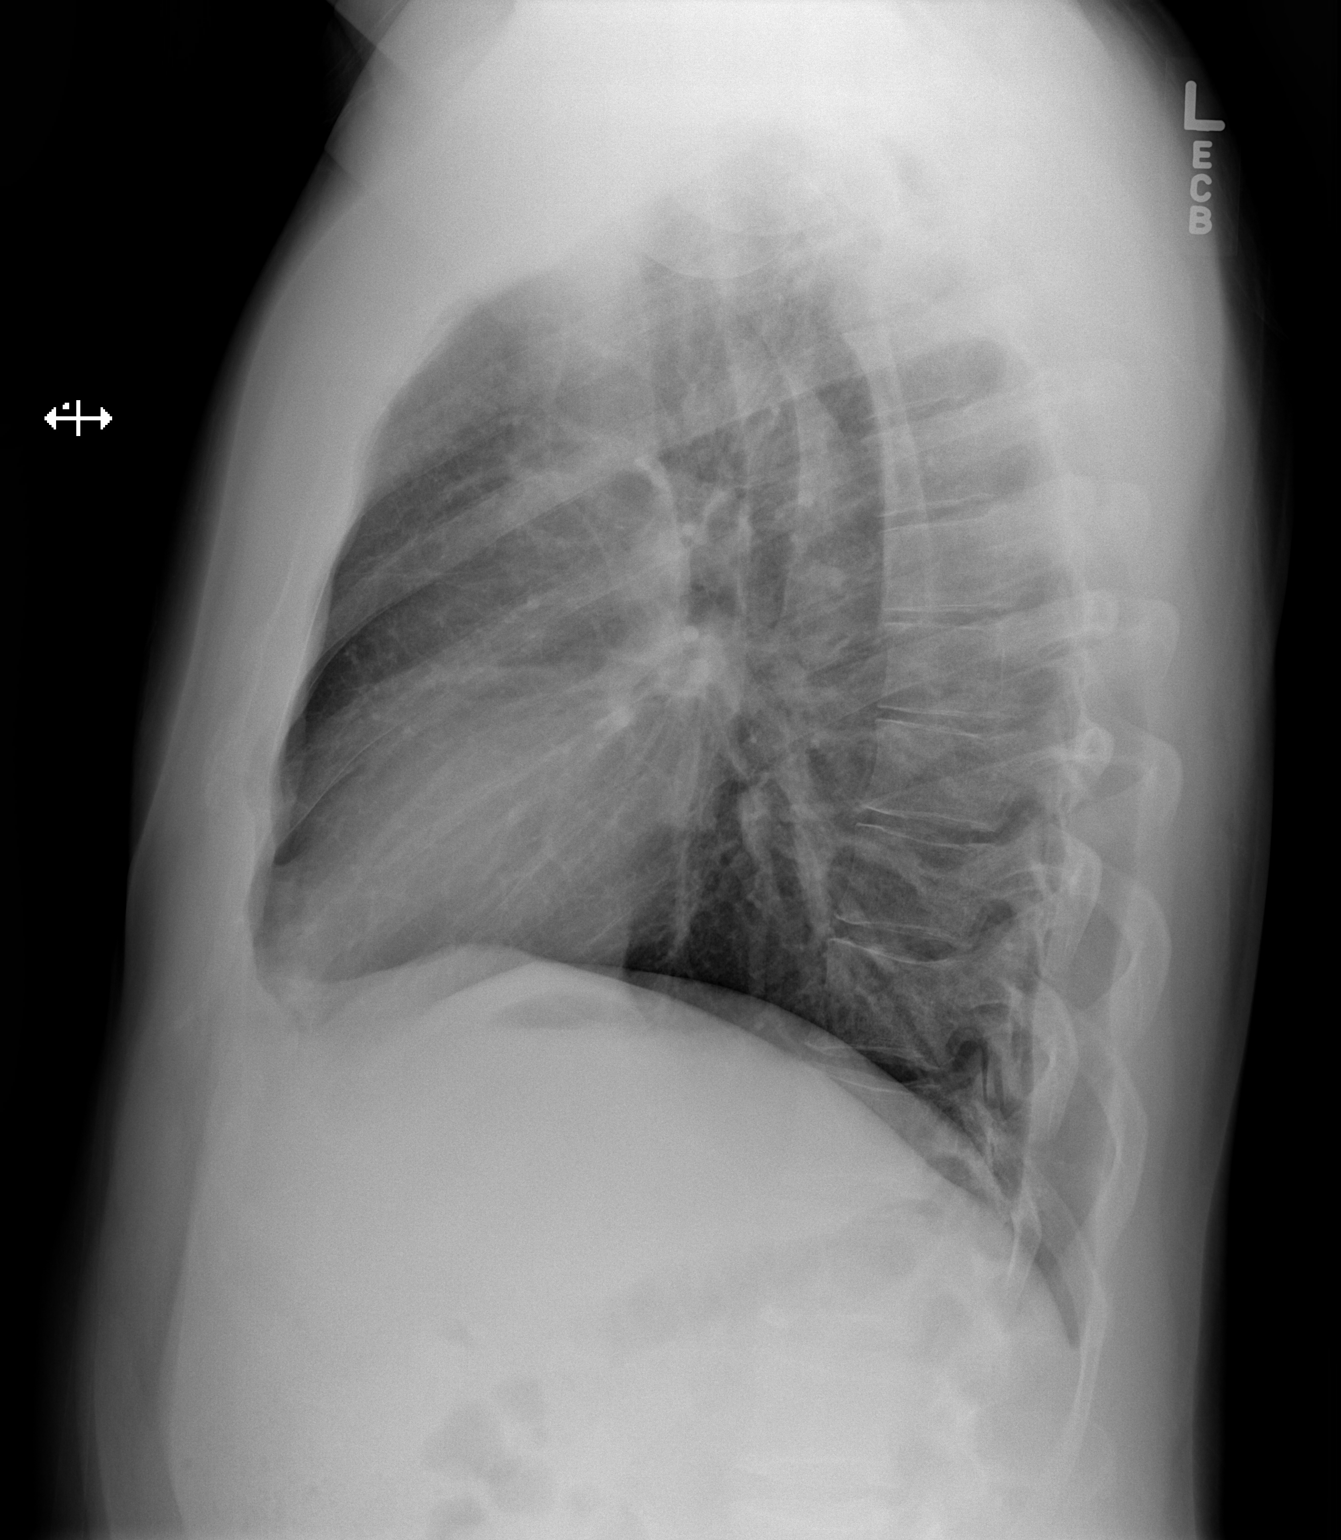

[2 of 2 positions shown; findings below may reference images not displayed]

FINDINGS: The heart size and mediastinal contours are within normal limits.
Both lungs are clear. The visualized skeletal structures are
unremarkable.
IMPRESSION: No active cardiopulmonary disease.
